# Patient Record
Sex: Male | Born: 1980 | Race: Black or African American | Hispanic: No | Marital: Single | Smoking: Current every day smoker
Health system: Southern US, Community
[De-identification: ages and names within clinical notes are randomized; demographics above are authoritative.]

## PROBLEM LIST (undated history)

## (undated) HISTORY — PX: TONSILLECTOMY: SUR1361

---

## 2012-02-21 ENCOUNTER — Ambulatory Visit: Payer: Self-pay | Admitting: Family Medicine

## 2013-12-18 ENCOUNTER — Ambulatory Visit: Payer: Self-pay | Admitting: Family Medicine

## 2013-12-18 LAB — URINALYSIS, COMPLETE
Bilirubin,UR: NEGATIVE
Blood: NEGATIVE
GLUCOSE, UR: NEGATIVE mg/dL (ref 0–75)
Ketone: NEGATIVE
LEUKOCYTE ESTERASE: NEGATIVE
Nitrite: NEGATIVE
Ph: 6.5 (ref 4.5–8.0)
Protein: NEGATIVE
Specific Gravity: 1.015 (ref 1.003–1.030)

## 2014-03-12 ENCOUNTER — Ambulatory Visit: Payer: Self-pay | Admitting: Physician Assistant

## 2015-07-11 IMAGING — CR DG LUMBAR SPINE COMPLETE 4+V
1 series · 5 of 5 positions shown · non-contrast
Comparison: None.

CLINICAL DATA: Low back pain

EXAM:
LUMBAR SPINE - COMPLETE 4+ VIEW

[Series 1: ap · 0.17mm/px · 5 of 5 slices shown]
[im 1/5]
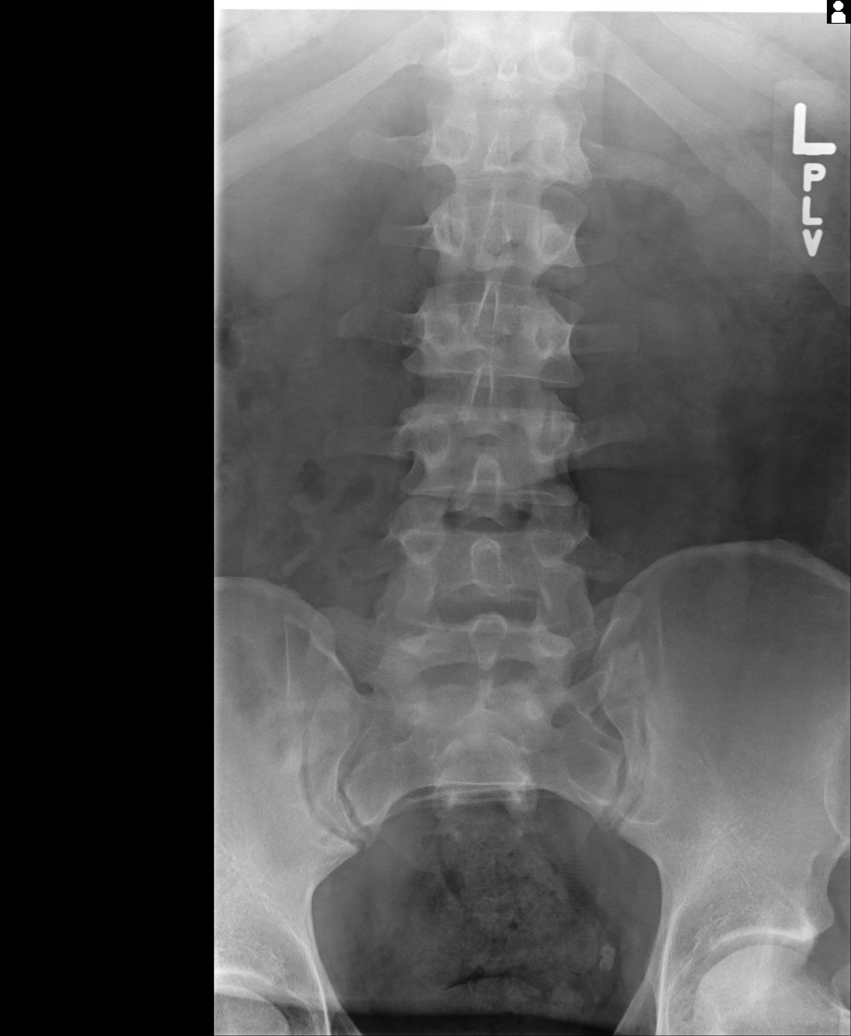
[im 2/5]
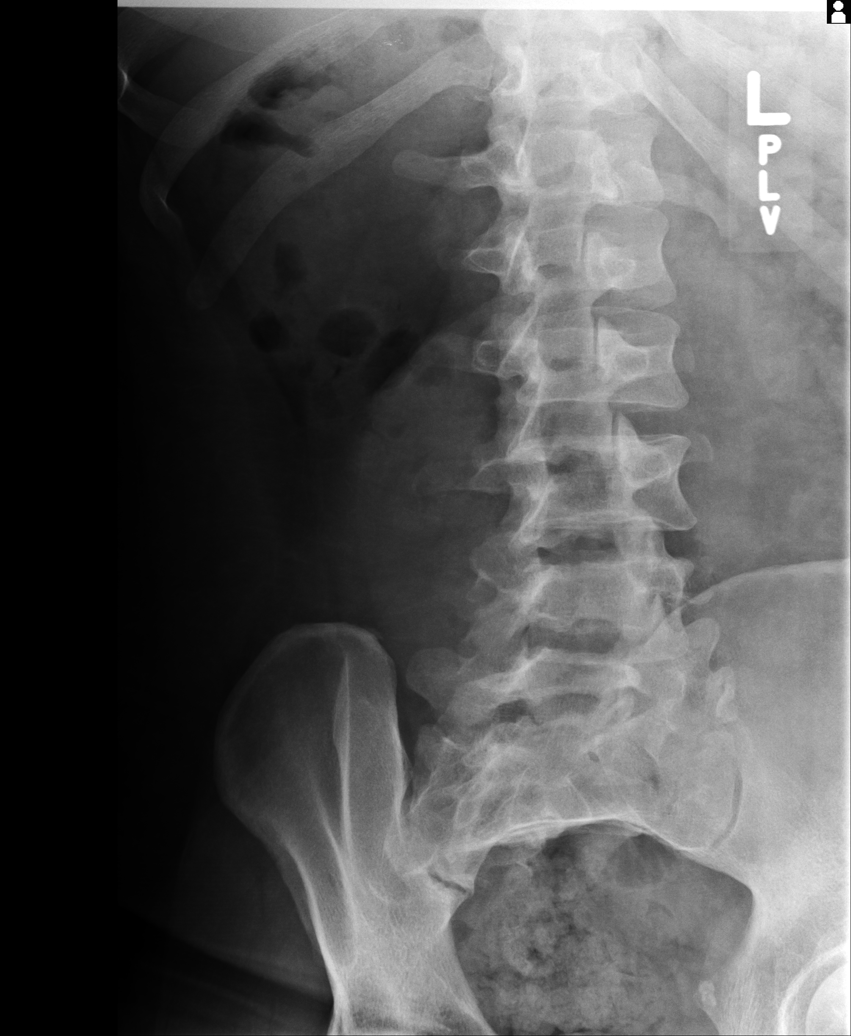
[im 3/5]
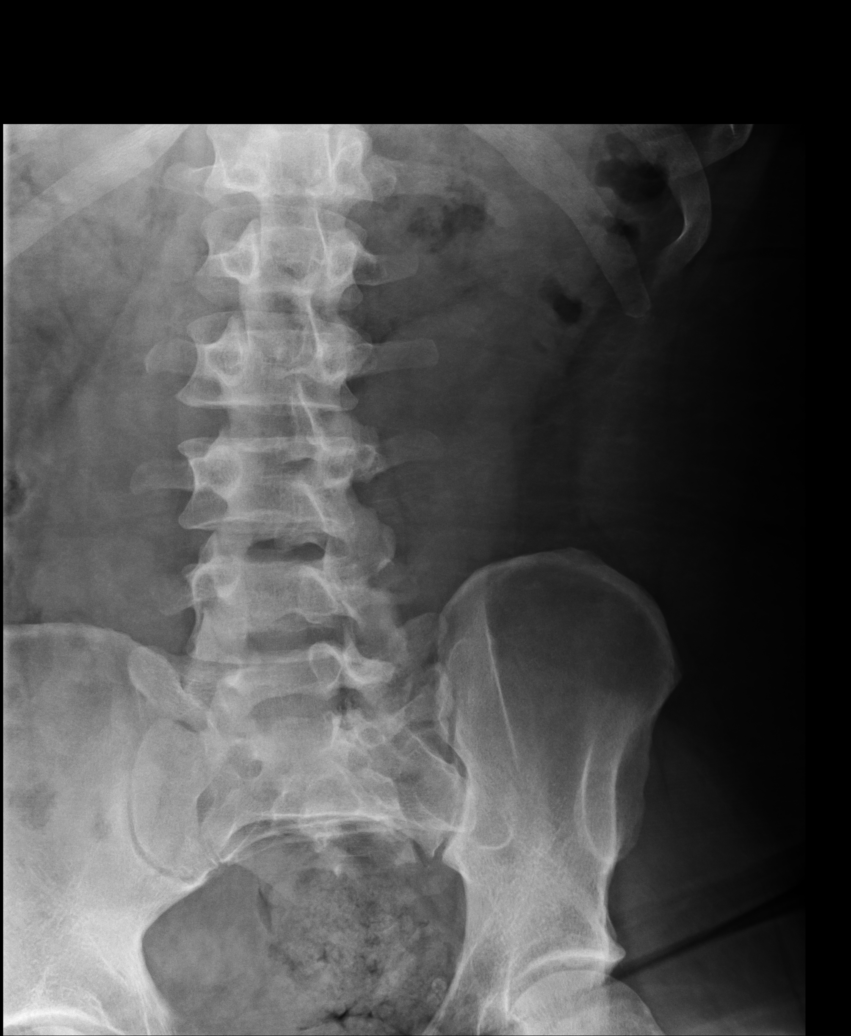
[im 4/5]
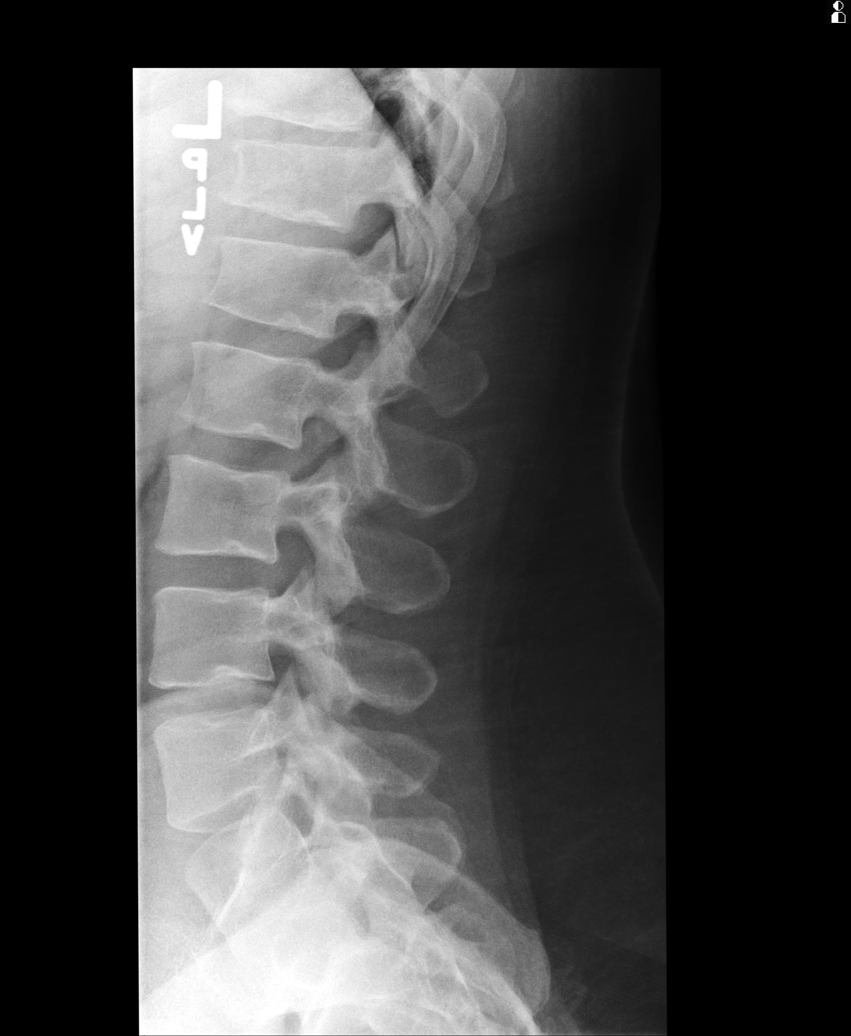
[im 5/5]
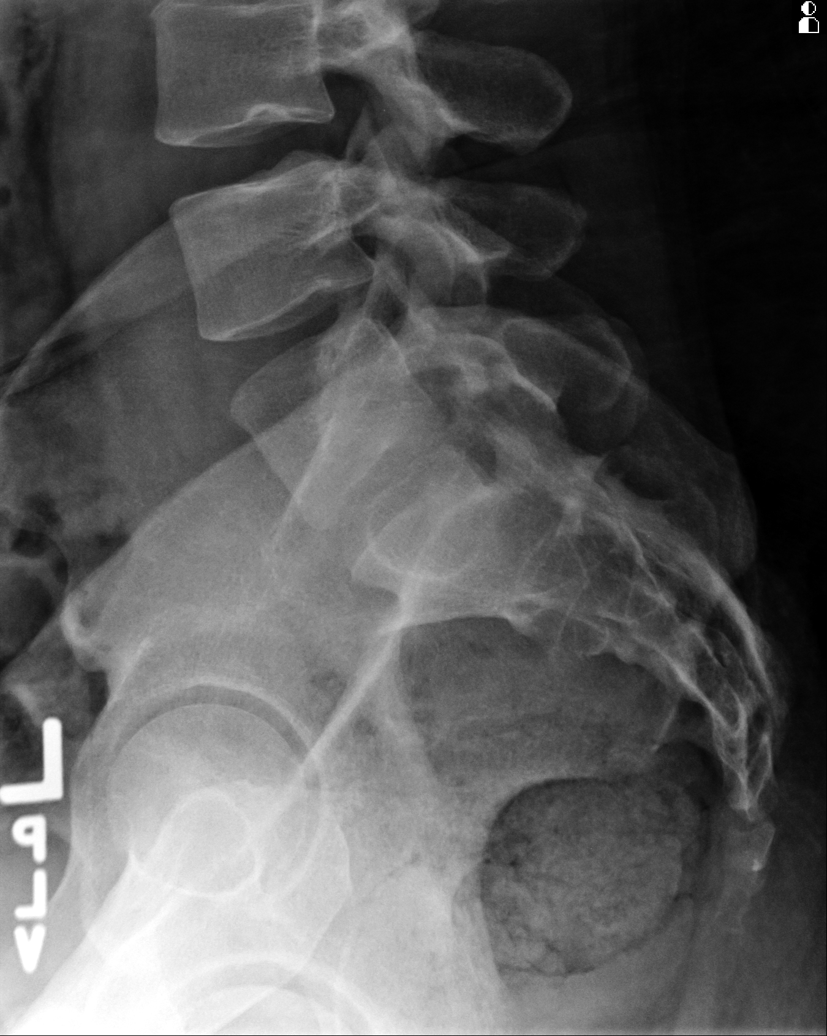

[5 of 5 positions shown; findings below may reference images not displayed]

FINDINGS: Vertebral body height is well maintained. No spondylolysis or
spondylolisthesis is seen. Mild disc space narrowing is noted at
L4-5. No soft tissue changes are seen.
IMPRESSION: Mild degenerative change without acute abnormality.

## 2019-05-08 ENCOUNTER — Encounter: Payer: Self-pay | Admitting: Emergency Medicine

## 2019-05-08 ENCOUNTER — Ambulatory Visit
Admission: EM | Admit: 2019-05-08 | Discharge: 2019-05-08 | Disposition: A | Discharge status: Home / Self Care | Payer: 59 | Attending: Family Medicine | Admitting: Family Medicine

## 2019-05-08 ENCOUNTER — Other Ambulatory Visit: Payer: Self-pay

## 2019-05-08 DIAGNOSIS — R430 Anosmia: Secondary | ICD-10-CM

## 2019-05-08 DIAGNOSIS — R509 Fever, unspecified: Secondary | ICD-10-CM

## 2019-05-08 DIAGNOSIS — L0501 Pilonidal cyst with abscess: Secondary | ICD-10-CM

## 2019-05-08 MED ORDER — CEFAZOLIN SODIUM 1 G IJ SOLR
1.0000 g | Freq: Once | INTRAMUSCULAR | Status: AC
  Administered 2019-05-08: 1 g via INTRAMUSCULAR

## 2019-05-08 MED ORDER — AMOXICILLIN-POT CLAVULANATE 875-125 MG PO TABS: 1.0000 | ORAL_TABLET | Freq: Two times a day (BID) | ORAL | 0 refills | Status: DC

## 2019-05-08 MED ORDER — ACETAMINOPHEN 500 MG PO TABS
1000.0000 mg | ORAL_TABLET | Freq: Once | ORAL | Status: AC
  Administered 2019-05-08: 1000 mg via ORAL

## 2019-05-08 MED ORDER — SULFAMETHOXAZOLE-TRIMETHOPRIM 800-160 MG PO TABS: 1.0000 | ORAL_TABLET | Freq: Two times a day (BID) | ORAL | 0 refills | Status: DC

## 2019-05-08 NOTE — ED Triage Notes (Signed)
Patient c/o pain in his lower back on Tuesday. Patient noticed some redness and swelling between his buttock since yesterday.  Patient denies fevers.

## 2019-05-08 NOTE — Discharge Instructions (Addendum)
Warm compresses to area Tylenol as needed Await test result

## 2019-05-08 NOTE — ED Provider Notes (Addendum)
[facility]    CSN: [id number] Arrival date & time: 05/08/19  1616      History   Chief Complaint Chief Complaint  Patient presents with  . Abscess    HPI Edward Coffey is a 38 y.o. male.   37 yo male with a c/o pain to the tailbone/upper buttocks area for the past 3 days with swelling and redness. Denies any fevers, chills, drainage.    Abscess   History reviewed. No pertinent past medical history.  There are no active problems to display for this patient.   Past Surgical History:  Procedure Laterality Date  . TONSILLECTOMY         Home Medications    Prior to Admission medications   Medication Sig Start Date End Date Taking? Authorizing Provider  amoxicillin-clavulanate (AUGMENTIN) 875-125 MG tablet Take 1 tablet by mouth 2 (two) times daily. 05/08/19   Payton Mccallum, MD  sulfamethoxazole-trimethoprim (BACTRIM DS) 800-160 MG tablet Take 1 tablet by mouth 2 (two) times daily. 05/08/19   Payton Mccallum, MD    Family History Family History  Problem Relation Age of Onset  . Sleep apnea Mother   . Diabetes Father   . Sleep apnea Father     Social History Social History   Tobacco Use  . Smoking status: Current Every Day Smoker  . Smokeless tobacco: Never Used  Substance Use Topics  . Alcohol use: Yes  . Drug use: Never     Allergies   Clarithromycin   Review of Systems Review of Systems   Physical Exam Triage Vital Signs ED Triage Vitals  Enc Vitals Group     BP 05/08/19 1632 126/85     Pulse Rate 05/08/19 1632 86     Resp 05/08/19 1632 16     Temp 05/08/19 1632 (!) 102.5 F (39.2 C)     Temp Source 05/08/19 1632 Oral     SpO2 05/08/19 1632 96 %     Weight 05/08/19 1629 (!) 340 lb (154.2 kg)     Height 05/08/19 1629 6\' 4"  (1.93 m)     Head Circumference --      Peak Flow --      Pain Score 05/08/19 1629 3     Pain Loc --      Pain Edu? --      Excl. in GC? --    No data found.  Updated Vital Signs BP 126/85  (BP Location: Right Arm)   Pulse 86   Temp (!) 102.5 F (39.2 C) (Oral)   Resp 16   Ht 6\' 4"  (1.93 m)   Wt (!) 154.2 kg   SpO2 96%   BMI 41.39 kg/m   Visual Acuity Right Eye Distance:   Left Eye Distance:   Bilateral Distance:    Right Eye Near:   Left Eye Near:    Bilateral Near:     Physical Exam Vitals signs and nursing note reviewed.  Constitutional:      General: He is not in acute distress.    Appearance: He is not toxic-appearing or diaphoretic.  Skin:    Findings: Lesion (3x2cm sacral skin soft tissue lesion, fluctuant with surrounding blanchable erythema, warmth and tenderness) present.  Neurological:     Mental Status: He is alert.      UC Treatments / Results  Labs (all labs ordered are listed, but only abnormal results are displayed) Labs Reviewed  NOVEL CORONAVIRUS, NAA (HOSP ORDER, SEND-OUT TO REF LAB; TAT 18-24  HRS)  AEROBIC/ANAEROBIC CULTURE (SURGICAL/DEEP WOUND)    EKG   Radiology No results found.  Procedures Incision and Drainage  Date/Time: 05/08/2019 8:09 PM Performed by: Norval Gable, MD Authorized by: Norval Gable, MD   Consent:    Consent obtained:  Verbal   Consent given by:  Patient   Risks discussed:  Bleeding, damage to other organs, infection, incomplete drainage and pain   Alternatives discussed:  No treatment Location:    Type:  Pilonidal cyst   Size:  3x2 cm Pre-procedure details:    Skin preparation:  Hibiclens Anesthesia (see MAR for exact dosages):    Anesthesia method:  None Procedure type:    Complexity:  Simple Procedure details:    Needle aspiration: no     Incision types:  Stab incision (superficial nick )   Incision depth:  Dermal   Scalpel blade:  11   Drainage:  Purulent (foul smelling (patient denies being able to smell)   Drainage amount:  Scant   Wound treatment:  Wound left open   Packing materials:  None Post-procedure details:    Patient tolerance of procedure:  Tolerated well, no  immediate complications   (including critical care time)  Medications Ordered in UC Medications  acetaminophen (TYLENOL) tablet 1,000 mg (1,000 mg Oral Given 05/08/19 1716)  ceFAZolin (ANCEF) injection 1 g (1 g Intramuscular Given 05/08/19 1737)    Initial Impression / Assessment and Plan / UC Course  I have reviewed the triage vital signs and the nursing notes.  Pertinent labs & imaging results that were available during my care of the patient were reviewed by me and considered in my medical decision making (see chart for details).      Final Clinical Impressions(s) / UC Diagnoses   Final diagnoses:  Pilonidal abscess  Fever, unspecified  Loss of sense of smell     Discharge Instructions     Warm compresses to area Tylenol as needed Await test result    ED Prescriptions    Medication Sig Dispense Auth. Provider   sulfamethoxazole-trimethoprim (BACTRIM DS) 800-160 MG tablet Take 1 tablet by mouth 2 (two) times daily. 20 tablet Norval Gable, MD   amoxicillin-clavulanate (AUGMENTIN) 875-125 MG tablet Take 1 tablet by mouth 2 (two) times daily. 20 tablet Norval Gable, MD      1. diagnosis reviewed with patient; ancef 1gm IM x 1 given; wound culture obtained 2. rx as per orders above; reviewed possible side effects, interactions, risks and benefits  3. Recommend supportive treatment as above 4. covid test done 5. Follow-up prn if symptoms worsen or don't improve   PDMP not reviewed this encounter.   Norval Gable, MD 05/08/19 2014    Norval Gable, MD 05/08/19 2015

## 2019-05-10 LAB — NOVEL CORONAVIRUS, NAA (HOSP ORDER, SEND-OUT TO REF LAB; TAT 18-24 HRS): SARS-CoV-2, NAA: NOT DETECTED

## 2019-05-12 ENCOUNTER — Telehealth (HOSPITAL_COMMUNITY): Payer: Self-pay | Admitting: Emergency Medicine

## 2019-05-12 LAB — AEROBIC/ANAEROBIC CULTURE W GRAM STAIN (SURGICAL/DEEP WOUND)

## 2019-05-12 LAB — AEROBIC/ANAEROBIC CULTURE (SURGICAL/DEEP WOUND): Special Requests: NORMAL

## 2019-05-12 NOTE — Telephone Encounter (Signed)
Patient was given augmentin and bactrim for wound, attempted to reach patient to see how he was feeling, no answer, left VM to return call if not better.

## 2020-06-30 ENCOUNTER — Other Ambulatory Visit: Payer: Self-pay

## 2020-06-30 ENCOUNTER — Ambulatory Visit
Admission: EM | Admit: 2020-06-30 | Discharge: 2020-06-30 | Disposition: A | Discharge status: Home / Self Care | Payer: BC Managed Care – PPO | Attending: Emergency Medicine | Admitting: Emergency Medicine

## 2020-06-30 DIAGNOSIS — N61 Mastitis without abscess: Secondary | ICD-10-CM

## 2020-06-30 MED ORDER — NAPROXEN 500 MG PO TABS: 500.0000 mg | ORAL_TABLET | Freq: Two times a day (BID) | ORAL | 0 refills | Status: DC

## 2020-06-30 MED ORDER — CEPHALEXIN 500 MG PO CAPS: 1000.0000 mg | ORAL_CAPSULE | Freq: Two times a day (BID) | ORAL | 0 refills | Status: AC

## 2020-06-30 NOTE — ED Provider Notes (Signed)
HPI  SUBJECTIVE:  Edward Coffey is a 39 y.o. male who presents with a painful mass at the edge of his left nipple starting 3 days ago.  He reports increasing erythema and swelling around his nipple, achy, itchy pain and a "irritated nipple".  No fevers, body, nausea, vomiting, trauma to the area.  No nipple discharge.  He tried squeezing it last night and also tried some Aleve.  The Aleve helps.  Symptoms are worse with palpation and with his shirt rubbing against it.  No antipyretic in the past 6 hours.  Past medical history negative for MRSA, diabetes, HIV, immunocompromise, artificial valves or joints.  PMD: None.  States that this is a recurring issue that always happens in this area.  Previous notes reviewed, it responded to antibiotics last time.  I do not see any record of an I&D.   History reviewed. No pertinent past medical history.  Past Surgical History:  Procedure Laterality Date  . TONSILLECTOMY      Family History  Problem Relation Age of Onset  . Sleep apnea Mother   . Diabetes Father   . Sleep apnea Father     Social History   Tobacco Use  . Smoking status: Current Every Day Smoker    Packs/day: 0.50    Types: Cigarettes  . Smokeless tobacco: Never Used  Vaping Use  . Vaping Use: Never used  Substance Use Topics  . Alcohol use: Yes    Comment: social  . Drug use: Never    No current facility-administered medications for this encounter.  Current Outpatient Medications:  .  cephALEXin (KEFLEX) 500 MG capsule, Take 2 capsules (1,000 mg total) by mouth 2 (two) times daily for 5 days., Disp: 20 capsule, Rfl: 0 .  naproxen (NAPROSYN) 500 MG tablet, Take 1 tablet (500 mg total) by mouth 2 (two) times daily., Disp: 20 tablet, Rfl: 0  Allergies  Allergen Reactions  . Clarithromycin Other (See Comments)     ROS  As noted in HPI.   Physical Exam  BP 126/89 (BP Location: Left Arm)   Pulse 87   Temp 98.4 F (36.9 C) (Oral)   Resp 17   Ht 6\' 4"  (1.93 m)    Wt (!) 143.3 kg   SpO2 98%   BMI 38.46 kg/m   Constitutional: Well developed, well nourished, no acute distress Eyes:  EOMI, conjunctiva normal bilaterally HENT: Normocephalic, atraumatic,mucus membranes moist Respiratory: Normal inspiratory effort Cardiovascular: Normal rate GI: nondistended skin: Left breast: 5 x 5 cm tender area of erythema, induration surrounding the nipple.  1 cm x 0.5 cm swollen, tender, nipple mass.  No expressible nipple drainage.  Marked area of erythema with a marker for reference    Lymph: No axillary lymphadenopathy Musculoskeletal: no deformities Neurologic: Alert & oriented x 3, no focal neuro deficits Psychiatric: Speech and behavior appropriate   ED Course   Medications - No data to display  No orders of the defined types were placed in this encounter.   No results found for this or any previous visit (from the past 24 hour(s)). No results found.  ED Clinical Impression  1. Mastitis      ED Assessment/Plan  Attempted an I&D with a sterile 18-gauge needle with no expressible purulent drainage after cleaning extensively with alcohol.  Patient with an infected nipple gland/mastitis.  Will send home with warm compresses, Keflex 1000 g p.o. twice daily for 5 days, Aleve/Tylenol.  Primary care list for ongoing care.  May  return here if not getting any better we can attempt more formal I&D at that time.  Discussed  MDM, plan and followup with patient . Patient  agrees with plan. Family agrees with plan  Meds ordered this encounter  Medications  . cephALEXin (KEFLEX) 500 MG capsule    Sig: Take 2 capsules (1,000 mg total) by mouth 2 (two) times daily for 5 days.    Dispense:  20 capsule    Refill:  0  . naproxen (NAPROSYN) 500 MG tablet    Sig: Take 1 tablet (500 mg total) by mouth 2 (two) times daily.    Dispense:  20 tablet    Refill:  0    *This clinic note was created using Scientist, clinical (histocompatibility and immunogenetics). Therefore, there may be  occasional mistakes despite careful proofreading.  ?    Domenick Gong, MD 06/30/20 1326

## 2020-06-30 NOTE — ED Triage Notes (Signed)
"  Boil around my nipple." States this is the 3rd time its happened in the same area.  Sx for 3 days

## 2020-06-30 NOTE — Discharge Instructions (Addendum)
Warm compresses, Naprosyn with 1000 mg of Tylenol twice a day.  May take an additional 1000 mg of Tylenol 1 or 2 more times a day.  Finish the Keflex, even if you feel better.  Here is a list of primary care providers who are taking new patients:  Dr. Elizabeth Sauer [address] [place] [address] [phone]  [facility] [address] [place], [place] [address] [phone]  [facility] [place] [address] [place] [address]  [phone]  [facility] [address] [place], [place] [address] [phone]  [facility] [address]  [phone] [place], [place] [address]   Go to www.goodrx.com to look up your medications. This will give you a list of where you can find your prescriptions at the most affordable prices. Or ask the pharmacist what the cash price is, or if they have any other discount programs available to help make your medication more affordable. This can be less expensive than what you would pay with insurance.

## 2020-12-29 ENCOUNTER — Other Ambulatory Visit: Payer: Self-pay

## 2020-12-29 ENCOUNTER — Ambulatory Visit
Admission: EM | Admit: 2020-12-29 | Discharge: 2020-12-29 | Disposition: A | Discharge status: Home / Self Care | Payer: BC Managed Care – PPO | Attending: Family Medicine | Admitting: Family Medicine

## 2020-12-29 DIAGNOSIS — L03116 Cellulitis of left lower limb: Secondary | ICD-10-CM

## 2020-12-29 MED ORDER — DOXYCYCLINE HYCLATE 100 MG PO CAPS: 100.0000 mg | ORAL_CAPSULE | Freq: Two times a day (BID) | ORAL | 0 refills | Status: DC

## 2020-12-29 NOTE — ED Triage Notes (Signed)
Pt reports having L leg swelling and redness that he noticed on yesterday. Area is also warm to touch.

## 2020-12-29 NOTE — ED Provider Notes (Signed)
[facility]    CSN: [id number] Arrival date & time: 12/29/20  1018  History   Chief Complaint Chief Complaint  Patient presents with   Leg Swelling    HPI  40 year old male presents the above complaint.  Patient reports that on Tuesday of this week he developed nausea, vomiting, diarrhea.  No associated fever and chills.  He believes that he had some sort of bug.  This resolved quickly.  He states that yesterday he noticed an area of swelling and redness on the left lower leg. Area is tender to palpation.  He denies fever.  He denies recent travel.  No calf swelling or calf pain.  No history of DVT.  No fall, trauma, injury.  He states that his pain is 9/10 in severity.  No relieving factors.  Home Medications    Prior to Admission medications   Medication Sig Start Date End Date Taking? Authorizing Provider  doxycycline (VIBRAMYCIN) 100 MG capsule Take 1 capsule (100 mg total) by mouth 2 (two) times daily. 12/29/20  Yes Tommie Sams, DO    Family History Family History  Problem Relation Age of Onset   Sleep apnea Mother    Diabetes Father    Sleep apnea Father     Social History Social History   Tobacco Use   Smoking status: Every Day    Packs/day: 0.50    Pack years: 0.00    Types: Cigarettes   Smokeless tobacco: Never  Vaping Use   Vaping Use: Never used  Substance Use Topics   Alcohol use: Yes    Comment: social   Drug use: Never     Allergies   Clarithromycin   Review of Systems Review of Systems Per HPI  Physical Exam Triage Vital Signs ED Triage Vitals  Enc Vitals Group     BP 12/29/20 1027 (!) 142/74     Pulse Rate 12/29/20 1027 83     Resp 12/29/20 1027 16     Temp 12/29/20 1027 98.8 F (37.1 C)     Temp Source 12/29/20 1027 Oral     SpO2 12/29/20 1027 98 %     Weight 12/29/20 1028 (!) 316 lb (143.3 kg)     Height 12/29/20 1028 6\' 4"  (1.93 m)     Head Circumference --      Peak Flow --      Pain Score 12/29/20 1028 9      Pain Loc --      Pain Edu? --      Excl. in GC? --    Updated Vital Signs BP (!) 142/74   Pulse 83   Temp 98.8 F (37.1 C) (Oral)   Resp 16   Ht 6\' 4"  (1.93 m)   Wt (!) 143.3 kg   SpO2 98%   BMI 38.46 kg/m   Visual Acuity Right Eye Distance:   Left Eye Distance:   Bilateral Distance:    Right Eye Near:   Left Eye Near:    Bilateral Near:     Physical Exam Constitutional:      General: He is not in acute distress.    Appearance: Normal appearance. He is obese. He is not ill-appearing.  HENT:     Head: Normocephalic and atraumatic.  Cardiovascular:     Rate and Rhythm: Normal rate and regular rhythm.  Pulmonary:     Effort: Pulmonary effort is normal.     Breath sounds: Normal breath sounds. No wheezing, rhonchi or rales.  Musculoskeletal:     Comments: Left lower leg -no calf pain.  Negative Homans' sign.  No significant edema of the lower leg extremity except the swelling that overlies the area of erythema (see Skin).  Skin:    Comments: Left anterior lower leg -there is a large, discrete area of erythema.  Area is exquisitely tender to palpation with minimal pressure.  There is palpable warmth.  No appreciable purulence.  Neurological:     Mental Status: He is alert.  Psychiatric:        Mood and Affect: Mood normal.        Behavior: Behavior normal.     UC Treatments / Results  Labs (all labs ordered are listed, but only abnormal results are displayed) Labs Reviewed - No data to display  EKG   Radiology No results found.  Procedures Procedures (including critical care time)  Medications Ordered in UC Medications - No data to display  Initial Impression / Assessment and Plan / UC Course  I have reviewed the triage vital signs and the nursing notes.  Pertinent labs & imaging results that were available during my care of the patient were reviewed by me and considered in my medical decision making (see chart for details).    40 year old male  presents with cellulitis.  Treating with doxycycline.  Advised to go to the ER if he fails improve or worsens in the next 48 hours.   Final Clinical Impressions(s) / UC Diagnoses   Final diagnoses:  Cellulitis of left lower extremity     Discharge Instructions      Antibiotic as prescribed.  If you worsen or do not improve in the next 48 hours, go to the ER.  Take care  Dr. Adriana Simas    ED Prescriptions     Medication Sig Dispense Auth. Provider   doxycycline (VIBRAMYCIN) 100 MG capsule Take 1 capsule (100 mg total) by mouth 2 (two) times daily. 20 capsule Tommie Sams, DO      PDMP not reviewed this encounter.   Tommie Sams, [place] 12/29/20 1132

## 2020-12-29 NOTE — Discharge Instructions (Addendum)
Antibiotic as prescribed.  If you worsen or do not improve in the next 48 hours, go to the ER.  Take care  Dr. Adriana Simas

## 2021-02-18 ENCOUNTER — Other Ambulatory Visit: Payer: Self-pay

## 2021-02-18 ENCOUNTER — Ambulatory Visit
Admission: EM | Admit: 2021-02-18 | Discharge: 2021-02-18 | Disposition: A | Discharge status: Home / Self Care | Payer: BC Managed Care – PPO | Attending: Emergency Medicine | Admitting: Emergency Medicine

## 2021-02-18 DIAGNOSIS — L03115 Cellulitis of right lower limb: Secondary | ICD-10-CM

## 2021-02-18 MED ORDER — DOXYCYCLINE HYCLATE 100 MG PO CAPS: 100.0000 mg | ORAL_CAPSULE | Freq: Two times a day (BID) | ORAL | 0 refills | Status: AC

## 2021-02-18 NOTE — ED Triage Notes (Signed)
Pt c/o possible cellulitis in his right lower leg. Pt recently had the same issue in his left leg. Pt denies any injury or other wound to the leg. Pt does report some nausea and chills, the area is red and inflamed.

## 2021-02-18 NOTE — Discharge Instructions (Addendum)
Take the Doxycycline twice daily with food for 10 days.  Doxycycline will make you more sensitive to sunburn so wear sunscreen when outdoors and reapply it every 90 minutes.  Use OTC Tylenol and Ibuprofen according to the package instructions as needed for pain.  Return for new or worsening symptoms.  This would include increased redness, swelling, pain, drainage, fever, or return of nausea and vomiting where you cannot keep down medications or fluids.

## 2021-02-18 NOTE — ED Provider Notes (Signed)
[facility]    CSN: [id number] Arrival date & time: 02/18/21  1153      History   Chief Complaint Chief Complaint  Patient presents with   Cellulitis    RLE   Recurrent Skin Infections    HPI Edward Coffey is a 40 y.o. male.   HPI  40 year old male here for evaluation of redness to his right lower extremity.  Patient reports that he noticed redness to his right lower extremity last night.  He states that he developed a fever, nausea vomiting, and chills.  He also reports he had a sore throat but that is resolved he thinks is from vomiting.  The nausea and vomiting and chills have resolved today.  He thinks his cellulitis as he was recently treated for the same sort of presentation on his left lower extremity.  He states that the area is red, hot, and tender.  He denies any known injury, cuts, or abrasions to his lower extremity that might have led to an infection.  History reviewed. No pertinent past medical history.  There are no problems to display for this patient.   Past Surgical History:  Procedure Laterality Date   TONSILLECTOMY         Home Medications    Prior to Admission medications   Medication Sig Start Date End Date Taking? Authorizing Provider  doxycycline (VIBRAMYCIN) 100 MG capsule Take 1 capsule (100 mg total) by mouth 2 (two) times daily. 02/18/21  Yes Becky Augusta, NP    Family History Family History  Problem Relation Age of Onset   Sleep apnea Mother    Diabetes Father    Sleep apnea Father     Social History Social History   Tobacco Use   Smoking status: Every Day    Packs/day: 0.50    Types: Cigarettes   Smokeless tobacco: Never  Vaping Use   Vaping Use: Never used  Substance Use Topics   Alcohol use: Yes    Comment: social   Drug use: Never     Allergies   Clarithromycin   Review of Systems Review of Systems  Constitutional:  Positive for chills and fever.  HENT:  Positive for sore throat.    Gastrointestinal:  Positive for nausea and vomiting.  Skin:  Positive for color change. Negative for rash and wound.  Neurological:  Negative for weakness and numbness.  Hematological: Negative.   Psychiatric/Behavioral: Negative.      Physical Exam Triage Vital Signs ED Triage Vitals  Enc Vitals Group     BP 02/18/21 1225 103/84     Pulse Rate 02/18/21 1225 96     Resp 02/18/21 1225 18     Temp 02/18/21 1225 99.2 F (37.3 C)     Temp Source 02/18/21 1225 Oral     SpO2 02/18/21 1225 98 %     Weight 02/18/21 1224 (!) 320 lb (145.2 kg)     Height 02/18/21 1224 6\' 4"  (1.93 m)     Head Circumference --      Peak Flow --      Pain Score 02/18/21 1224 6     Pain Loc --      Pain Edu? --      Excl. in GC? --    No data found.  Updated Vital Signs BP 103/84 (BP Location: Left Arm)   Pulse 96   Temp 99.2 F (37.3 C) (Oral)   Resp 18   Ht 6\' 4"  (1.93 m)  Wt (!) 320 lb (145.2 kg)   SpO2 98%   BMI 38.95 kg/m   Visual Acuity Right Eye Distance:   Left Eye Distance:   Bilateral Distance:    Right Eye Near:   Left Eye Near:    Bilateral Near:     Physical Exam Vitals and nursing note reviewed.  Constitutional:      General: He is not in acute distress.    Appearance: Normal appearance. He is normal weight. He is not ill-appearing.  HENT:     Head: Normocephalic and atraumatic.  Cardiovascular:     Rate and Rhythm: Normal rate and regular rhythm.     Pulses: Normal pulses.     Heart sounds: Normal heart sounds. No murmur heard.   No gallop.  Pulmonary:     Effort: Pulmonary effort is normal.     Breath sounds: Normal breath sounds. No wheezing, rhonchi or rales.  Musculoskeletal:        General: Tenderness present. No deformity. Normal range of motion.     Cervical back: Normal range of motion and neck supple.  Lymphadenopathy:     Cervical: No cervical adenopathy.  Skin:    General: Skin is warm and dry.     Capillary Refill: Capillary refill takes less  than 2 seconds.     Findings: Erythema present. No bruising, lesion or rash.  Neurological:     General: No focal deficit present.     Mental Status: He is alert and oriented to person, place, and time.  Psychiatric:        Mood and Affect: Mood normal.        Behavior: Behavior normal.        Thought Content: Thought content normal.        Judgment: Judgment normal.     UC Treatments / Results  Labs (all labs ordered are listed, but only abnormal results are displayed) Labs Reviewed - No data to display  EKG   Radiology No results found.  Procedures Procedures (including critical care time)  Medications Ordered in UC Medications - No data to display  Initial Impression / Assessment and Plan / UC Course  I have reviewed the triage vital signs and the nursing notes.  Pertinent labs & imaging results that were available during my care of the patient were reviewed by me and considered in my medical decision making (see chart for details).  Patient is a very pleasant, nontoxic-appearing 40 year old male here for evaluation of redness, tenderness, and swelling to the right lower extremity that began last night and was associated with fever and chills.  He states is a similar presentation to his previous episode of cellulitis on his left lower extremity for which she took doxycycline and resolved.  He is unaware of any injury, scrape, or cut to his lower leg that may have precipitated the infection.  He denies any numbness or tingling in his foot.  He has no pain in his calf.  Physical exam reveals a negative cardiopulmonary exam with clear lung sounds in all fields.  Patient is a cervical lymphadenopathy appreciated exam.  Patient has no tenderness in his calf and negative Denna Haggard' sign on the right.  There is an area of warmth, tender, erythema that extends just below the ankle to just below the knee on the anterior medial aspect.  There is no visible wound that may have been a portal  of entry for bacteria.  Patient is DP PT pulses are 2+.  Patient is forage motion sensation in his right foot.  Patient exam is consistent with cellulitis and will treat with doxycycline twice daily for 10 days.  Patient vies return if he has increase in fever, return of nausea vomiting, increased swelling, pain, or redness in his right lower extremity.   Final Clinical Impressions(s) / UC Diagnoses   Final diagnoses:  Cellulitis of leg, right     Discharge Instructions      Take the Doxycycline twice daily with food for 10 days.  Doxycycline will make you more sensitive to sunburn so wear sunscreen when outdoors and reapply it every 90 minutes.  Use OTC Tylenol and Ibuprofen according to the package instructions as needed for pain.  Return for new or worsening symptoms.  This would include increased redness, swelling, pain, drainage, fever, or return of nausea and vomiting where you cannot keep down medications or fluids.     ED Prescriptions     Medication Sig Dispense Auth. Provider   doxycycline (VIBRAMYCIN) 100 MG capsule Take 1 capsule (100 mg total) by mouth 2 (two) times daily. 20 capsule Becky Augusta, NP      PDMP not reviewed this encounter.   Becky Augusta, NP 02/18/21 1255
